# Patient Record
Sex: Female | Born: 1975 | Race: Black or African American | Hispanic: No | Marital: Married | Smoking: Never smoker
Health system: Southern US, Community
[De-identification: ages and names within clinical notes are randomized; demographics above are authoritative.]

## PROBLEM LIST (undated history)

## (undated) DIAGNOSIS — N200 Calculus of kidney: Secondary | ICD-10-CM

## (undated) DIAGNOSIS — I10 Essential (primary) hypertension: Secondary | ICD-10-CM

## (undated) HISTORY — DX: Calculus of kidney: N20.0

## (undated) HISTORY — PX: TUBAL LIGATION: SHX77

---

## 2015-02-27 ENCOUNTER — Encounter: Payer: Self-pay | Admitting: Emergency Medicine

## 2015-02-27 ENCOUNTER — Ambulatory Visit
Admission: EM | Admit: 2015-02-27 | Discharge: 2015-02-27 | Disposition: A | Discharge status: Home / Self Care | Payer: BC Managed Care – PPO | Attending: Family Medicine | Admitting: Family Medicine

## 2015-02-27 DIAGNOSIS — H6120 Impacted cerumen, unspecified ear: Secondary | ICD-10-CM

## 2015-02-27 DIAGNOSIS — J029 Acute pharyngitis, unspecified: Secondary | ICD-10-CM

## 2015-02-27 DIAGNOSIS — H6123 Impacted cerumen, bilateral: Secondary | ICD-10-CM | POA: Diagnosis not present

## 2015-02-27 HISTORY — DX: Essential (primary) hypertension: I10

## 2015-02-27 LAB — RAPID STREP SCREEN (MED CTR MEBANE ONLY): Streptococcus, Group A Screen (Direct): NEGATIVE

## 2015-02-27 NOTE — ED Provider Notes (Addendum)
CSN: [id number]     Arrival date & time 02/27/15  1420 History   First MD Initiated Contact with Patient 02/27/15 1529     Chief Complaint  Patient presents with  . Sore Throat   (Consider location/radiation/quality/duration/timing/severity/associated sxs/prior Treatment) HPI  This a 39 year old female who presents with a sore throat with white patches and  ear pain lerft is greater than right  For 2-3 days. She has no fever or chills but has had some night sweats. She's had no discharge from her ears. Denies any coughing.        Past Medical History  Diagnosis Date  . Hypertension    Past Surgical History  Procedure Laterality Date  . Tubal ligation     History reviewed. No pertinent family history. Social History  Substance Use Topics  . Smoking status: Never Smoker   . Smokeless tobacco: None  . Alcohol Use: No   OB History    No data available     Review of Systems  Constitutional: Negative for fever and chills.  HENT: Positive for ear pain and mouth sores.   All other systems reviewed and are negative.   Allergies  Review of patient's allergies indicates no known allergies.  Home Medications   Prior to Admission medications   Medication Sig Start Date End Date Taking? Authorizing Provider  lisinopril (PRINIVIL,ZESTRIL) 10 MG tablet Take 10 mg by mouth daily.   Yes Historical Provider, MD  tolterodine (DETROL) 2 MG tablet Take 2 mg by mouth 2 (two) times daily.   Yes Historical Provider, MD   Meds Ordered and Administered this Visit  Medications - No data to display  BP 128/95 mmHg  Pulse 93  Temp(Src) 98.7 F (37.1 C) (Tympanic)  Resp 16  Ht 5\' 6"  (1.676 m)  Wt 190 lb (86.183 kg)  BMI 30.68 kg/m2  SpO2 100%  LMP 02/21/2015 (Exact Date) No data found.   Physical Exam  Constitutional: She is oriented to person, place, and time. She appears well-developed and well-nourished.  HENT:  Head: Normocephalic and atraumatic.  Examination of the right  ear shows cerumen in the fair portion of the canal but the drum is visualized. It is dull but shows no retraction or swelling. There is no erythema. The left TM is not able to be visualized as cerumen is impacted the canal. She has no mastoid tenderness present. The oropharynx shows no swelling of the tonsils but does have some small white exudate present bilaterally. There is no erythema of the pharynx. She has no rhinorrhea. There is no cervical adenopathy present.  Eyes: EOM are normal. Pupils are equal, round, and reactive to light.  Neck: Normal range of motion. Neck supple. No thyromegaly present.  Pulmonary/Chest: Effort normal and breath sounds normal. No respiratory distress. She has no wheezes. She has no rales.  Musculoskeletal: Normal range of motion.  Lymphadenopathy:    She has no cervical adenopathy.  Neurological: She is alert and oriented to person, place, and time.  Skin: Skin is warm and dry.  Psychiatric: She has a normal mood and affect. Her behavior is normal. Judgment and thought content normal.    ED Course  Procedures (including critical care time)  Labs Review Labs Reviewed  RAPID STREP SCREEN (NOT AT [facility])  CULTURE, GROUP A STREP ([place])    Imaging Review No results found.   Visual Acuity Review  Right Eye Distance:   Left Eye Distance:   Bilateral Distance:    Right  Eye Near:   Left Eye Near:    Bilateral Near:     Medications - No data to display  15:54:33 Orders Placed WR  Ear wax removal       MDM   1. Acute pharyngitis, unspecified pharyngitis type   2. Cerumen impaction, unspecified laterality    New Prescriptions   No medications on file  Plan: 1. Diagnosis reviewed with patient 2. rx as per orders; risks, benefits, potential side effects reviewed with patient 3. Recommend supportive treatment with gargles,sucrets. Call 48 hours for culture results.  4. F/u prn if symptoms worsen or don't improve.     Lorin Picket,  PA-C 02/27/15 1621  Lorin Picket, PA-C 03/04/15 2023

## 2015-02-27 NOTE — Discharge Instructions (Signed)

## 2015-02-27 NOTE — ED Notes (Signed)
Patient c/o sore throat for couple of days and also bilateral ear pain.  Patient denies fevers.

## 2015-03-02 LAB — CULTURE, GROUP A STREP (THRC)

## 2015-04-13 DIAGNOSIS — N3946 Mixed incontinence: Secondary | ICD-10-CM | POA: Insufficient documentation

## 2015-04-13 DIAGNOSIS — I10 Essential (primary) hypertension: Secondary | ICD-10-CM | POA: Insufficient documentation

## 2017-03-17 DIAGNOSIS — N816 Rectocele: Secondary | ICD-10-CM | POA: Insufficient documentation

## 2017-07-21 ENCOUNTER — Ambulatory Visit: Payer: Self-pay | Admitting: Obstetrics and Gynecology

## 2017-07-31 ENCOUNTER — Encounter: Payer: Self-pay | Admitting: Advanced Practice Midwife

## 2017-07-31 ENCOUNTER — Ambulatory Visit (INDEPENDENT_AMBULATORY_CARE_PROVIDER_SITE_OTHER): Payer: BC Managed Care – PPO | Admitting: Advanced Practice Midwife

## 2017-07-31 VITALS — BP 124/86 | HR 96 | Ht 66.0 in | Wt 210.0 lb

## 2017-07-31 DIAGNOSIS — B9689 Other specified bacterial agents as the cause of diseases classified elsewhere: Secondary | ICD-10-CM | POA: Diagnosis not present

## 2017-07-31 DIAGNOSIS — N760 Acute vaginitis: Secondary | ICD-10-CM | POA: Diagnosis not present

## 2017-07-31 DIAGNOSIS — B379 Candidiasis, unspecified: Secondary | ICD-10-CM

## 2017-07-31 MED ORDER — METRONIDAZOLE 500 MG PO TABS: 500.0000 mg | ORAL_TABLET | Freq: Two times a day (BID) | ORAL | 0 refills | Status: AC

## 2017-07-31 MED ORDER — FLUCONAZOLE 150 MG PO TABS: 150.0000 mg | ORAL_TABLET | Freq: Once | ORAL | 1 refills | Status: AC

## 2017-07-31 NOTE — Progress Notes (Signed)
S: The patient is here today with complaint of primarily external vaginal itching and irritation. About 2 weeks ago she began having symptoms of discharge and pain with some itching. She took a 3 day OTC Clotrimazole. She started her period on the last day of that treatment. Since then she has mostly noticed the external itching and irritation. The only thing that she has done differently is a bubble bath she took about a week before the onset of symptoms. She has not been sexually active for 7 years. She uses a feminine wash. She does not douche.   O: Vital Signs: BP 124/86 (BP Location: Left Arm, Patient Position: Sitting, Cuff Size: Large)   Pulse 96   Ht 5\' 6"  (1.676 m)   Wt 210 lb (95.3 kg)   LMP 07/20/2017   BMI 33.89 kg/m  Constitutional: Well nourished, well developed female in no acute distress.  HEENT: normal Skin: Warm and dry.  Cardiovascular: Regular rate and rhythm.   Respiratory: Clear to auscultation bilateral. Normal respiratory effort Psych: Alert and Oriented x3. No memory deficits. Normal mood and affect.  MS: normal gait, normal bilateral lower extremity ROM/strength/stability.  Pelvic exam:  is not limited by body habitus EGBUS: within normal limits Vagina: within normal limits and with normal mucosa, small amount thin white discharge Cervix: normal appearance  Wet Prep: negative for yeast, positive for a few clue cells, negative for whiff  A: 42 yo female with mild bacterial vaginosis  P: Rx for metronidazole Rx for diflucan if develops yeast infection Sea salt soak in tub OTC boric acid treatment for vaginal pH Return to clinic PRN  Rod Can, CNM

## 2017-07-31 NOTE — Addendum Note (Signed)
Addended by: Rod Can on: 07/31/2017 04:22 PM   Modules accepted: Orders

## 2017-08-02 LAB — NUSWAB VAGINITIS (VG)
CANDIDA GLABRATA, NAA: NEGATIVE
Candida albicans, NAA: NEGATIVE
Trich vag by NAA: NEGATIVE

## 2018-03-17 ENCOUNTER — Encounter: Payer: Self-pay | Admitting: Emergency Medicine

## 2018-03-17 ENCOUNTER — Emergency Department: Payer: BC Managed Care – PPO

## 2018-03-17 ENCOUNTER — Emergency Department
Admission: EM | Admit: 2018-03-17 | Discharge: 2018-03-17 | Disposition: A | Discharge status: Home / Self Care | Payer: BC Managed Care – PPO | Attending: Emergency Medicine | Admitting: Emergency Medicine

## 2018-03-17 ENCOUNTER — Other Ambulatory Visit: Payer: Self-pay

## 2018-03-17 DIAGNOSIS — K7689 Other specified diseases of liver: Secondary | ICD-10-CM | POA: Insufficient documentation

## 2018-03-17 DIAGNOSIS — Z79899 Other long term (current) drug therapy: Secondary | ICD-10-CM | POA: Insufficient documentation

## 2018-03-17 DIAGNOSIS — K769 Liver disease, unspecified: Secondary | ICD-10-CM

## 2018-03-17 DIAGNOSIS — N200 Calculus of kidney: Secondary | ICD-10-CM | POA: Diagnosis not present

## 2018-03-17 DIAGNOSIS — R1031 Right lower quadrant pain: Secondary | ICD-10-CM | POA: Diagnosis present

## 2018-03-17 DIAGNOSIS — N83201 Unspecified ovarian cyst, right side: Secondary | ICD-10-CM | POA: Insufficient documentation

## 2018-03-17 DIAGNOSIS — I10 Essential (primary) hypertension: Secondary | ICD-10-CM | POA: Insufficient documentation

## 2018-03-17 LAB — URINALYSIS, COMPLETE (UACMP) WITH MICROSCOPIC
BACTERIA UA: NONE SEEN
BILIRUBIN URINE: NEGATIVE
GLUCOSE, UA: NEGATIVE mg/dL
KETONES UR: NEGATIVE mg/dL
Leukocytes, UA: NEGATIVE
Nitrite: NEGATIVE
PROTEIN: 30 mg/dL — AB
Specific Gravity, Urine: 1.017 (ref 1.005–1.030)
pH: 5 (ref 5.0–8.0)

## 2018-03-17 LAB — CBC
HEMATOCRIT: 37.5 % (ref 35.0–47.0)
Hemoglobin: 11.7 g/dL — ABNORMAL LOW (ref 12.0–16.0)
MCH: 26.7 pg (ref 26.0–34.0)
MCHC: 31.3 g/dL — AB (ref 32.0–36.0)
MCV: 85.3 fL (ref 80.0–100.0)
Platelets: 430 10*3/uL (ref 150–440)
RBC: 4.39 MIL/uL (ref 3.80–5.20)
RDW: 14.5 % (ref 11.5–14.5)
WBC: 7.7 10*3/uL (ref 3.6–11.0)

## 2018-03-17 LAB — HCG, QUANTITATIVE, PREGNANCY: hCG, Beta Chain, Quant, S: <1 m[IU]/mL (ref <5)

## 2018-03-17 LAB — COMPREHENSIVE METABOLIC PANEL
ALBUMIN: 4 g/dL (ref 3.5–5.0)
ALK PHOS: 60 U/L (ref 38–126)
ALT: 10 U/L (ref 0–44)
ANION GAP: 10 (ref 5–15)
AST: 17 U/L (ref 15–41)
BUN: 12 mg/dL (ref 6–20)
CHLORIDE: 106 mmol/L (ref 98–111)
CO2: 25 mmol/L (ref 22–32)
Calcium: 9 mg/dL (ref 8.9–10.3)
Creatinine, Ser: 0.61 mg/dL (ref 0.44–1.00)
GFR calc Af Amer: >60 mL/min (ref >60)
GFR calc non Af Amer: >60 mL/min (ref >60)
GLUCOSE: 112 mg/dL — AB (ref 70–99)
POTASSIUM: 3.9 mmol/L (ref 3.5–5.1)
SODIUM: 141 mmol/L (ref 135–145)
Total Bilirubin: 0.5 mg/dL (ref 0.3–1.2)
Total Protein: 8.1 g/dL (ref 6.5–8.1)

## 2018-03-17 LAB — LIPASE, BLOOD: LIPASE: 24 U/L (ref 11–51)

## 2018-03-17 LAB — POCT PREGNANCY, URINE: Preg Test, Ur: NEGATIVE

## 2018-03-17 MED ORDER — ONDANSETRON HCL 4 MG/2ML IJ SOLN
4.0000 mg | Freq: Once | INTRAMUSCULAR | Status: AC
  Administered 2018-03-17: 4 mg via INTRAVENOUS

## 2018-03-17 MED ORDER — ONDANSETRON HCL 4 MG/2ML IJ SOLN
INTRAMUSCULAR | Status: AC
  Administered 2018-03-17: 4 mg via INTRAVENOUS
  Filled 2018-03-17: qty 2

## 2018-03-17 MED ORDER — IOPAMIDOL (ISOVUE-300) INJECTION 61%
100.0000 mL | Freq: Once | INTRAVENOUS | Status: AC | PRN
  Administered 2018-03-17: 100 mL via INTRAVENOUS

## 2018-03-17 MED ORDER — MORPHINE SULFATE (PF) 4 MG/ML IV SOLN
INTRAVENOUS | Status: AC
  Administered 2018-03-17: 4 mg via INTRAVENOUS
  Filled 2018-03-17: qty 1

## 2018-03-17 MED ORDER — MORPHINE SULFATE (PF) 4 MG/ML IV SOLN
4.0000 mg | Freq: Once | INTRAVENOUS | Status: AC
  Administered 2018-03-17: 4 mg via INTRAVENOUS

## 2018-03-17 NOTE — ED Notes (Signed)
Patient transported to CT at this time.  Will continue to monitor. 

## 2018-03-17 NOTE — ED Notes (Signed)
Pt reports having RLQ abdominal pain that started this morning. Pt states that she has also had Nausea and diarrhea. Pt denies fever, chills, vomiting, dysuria, or polyuria. Pt appears uncomfortable at this time, rocking in bed.

## 2018-03-17 NOTE — ED Triage Notes (Signed)
R lower abdominal pain began this am.

## 2018-03-17 NOTE — ED Provider Notes (Signed)
[facility] Emergency Department Provider Note  ____________________________________________  Time seen: Approximately 11:32 AM  I have reviewed the triage vital signs and the nursing notes.   HISTORY  Chief Complaint Abdominal Pain   HPI Lauren Maddox is a 42 y.o. female with a history of hypertension who presents for evaluation of abdominal pain.  Patient reports that she woke up in her usual state of health.  She was sitting on a desk work and this morning when she started having pain in the right lower quadrant.  She reports that the pain was sudden onset, located in the right lower quadrant, constant and nonradiating, the pain is sharp.  She denies any prior history of kidney stones.  She was told once that she had a small ovarian cyst.  Last menstrual period was 3 weeks ago.  Patient has had tubal ligation but no other abdominal surgeries.  She has had mild nausea but no vomiting, no diarrhea or constipation, no dysuria, no vaginal bleeding, no fever or chills.  Past Medical History:  Diagnosis Date  . Hypertension     Patient Active Problem List   Diagnosis Date Noted  . Rectocele 03/17/2017  . Urinary incontinence, mixed 04/13/2015  . Benign essential HTN 04/13/2015    Past Surgical History:  Procedure Laterality Date  . TUBAL LIGATION      Prior to Admission medications   Medication Sig Start Date End Date Taking? Authorizing Provider  aluminum chloride (DRYSOL) 20 % external solution Apply topically. 05/26/17 05/26/18  [provider]  clobetasol (TEMOVATE) 0.05 % external solution Apply topically. 05/26/17 05/26/18  [provider]  econazole nitrate 1 % cream Apply topically. 04/05/17 04/05/18  [provider]  ibuprofen (ADVIL,MOTRIN) 800 MG tablet Take 800 mg by mouth. 02/07/17   [provider]  losartan (COZAAR) 50 MG tablet  07/13/17   [provider]  norethindrone  (MICRONOR,CAMILA,ERRIN) 0.35 MG tablet Take by mouth. 02/03/17 02/03/18  [provider]  oxybutynin (DITROPAN-XL) 5 MG 24 hr tablet  06/20/17   [provider]  tolterodine (DETROL) 2 MG tablet Take 2 mg by mouth 2 (two) times daily.    [provider]  triamcinolone cream (KENALOG) 0.1 % Apply topically. 05/26/17 05/26/18  [provider]    Allergies Patient has no known allergies.  No family history on file.  Social History Social History   Tobacco Use  . Smoking status: Never Smoker  . Smokeless tobacco: Never Used  Substance Use Topics  . Alcohol use: No  . Drug use: No    Review of Systems  Constitutional: Negative for fever. Eyes: Negative for visual changes. ENT: Negative for sore throat. Neck: No neck pain  Cardiovascular: Negative for chest pain. Respiratory: Negative for shortness of breath. Gastrointestinal: + RLQ abdominal pain and nausea. No vomiting or diarrhea. Genitourinary: Negative for dysuria. Musculoskeletal: Negative for back pain. Skin: Negative for rash. Neurological: Negative for headaches, weakness or numbness. Psych: No SI or HI  ____________________________________________   PHYSICAL EXAM:  VITAL SIGNS: ED Triage Vitals  Enc Vitals Group     BP 03/17/18 1022 (!) 149/94     Pulse Rate 03/17/18 1022 100     Resp 03/17/18 1022 18     Temp 03/17/18 1022 98.1 F (36.7 C)     Temp Source 03/17/18 1022 Oral     SpO2 03/17/18 1022 98 %     Weight 03/17/18 1025 200 lb (90.7 kg)  Height 03/17/18 1025 5\' 6"  (1.676 m)     Head Circumference --      Peak Flow --      Pain Score 03/17/18 1024 8     Pain Loc --      Pain Edu? --      Excl. in [place]? --     Constitutional: Alert and oriented. Well appearing and in no apparent distress. HEENT:      Head: Normocephalic and atraumatic.         Eyes: Conjunctivae are normal. Sclera is non-icteric.       Mouth/Throat: Mucous membranes are moist.       Neck:  Supple with no signs of meningismus. Cardiovascular: Regular rate and rhythm. No murmurs, gallops, or rubs. 2+ symmetrical distal pulses are present in all extremities. No JVD. Respiratory: Normal respiratory effort. Lungs are clear to auscultation bilaterally. No wheezes, crackles, or rhonchi.  Gastrointestinal: Soft, mild tenderness to palpation over the RLQ, and non distended with positive bowel sounds. No rebound or guarding. Genitourinary: No CVA tenderness. Musculoskeletal: Nontender with normal range of motion in all extremities. No edema, cyanosis, or erythema of extremities. Neurologic: Normal speech and language. Face is symmetric. Moving all extremities. No gross focal neurologic deficits are appreciated. Skin: Skin is warm, dry and intact. No rash noted. Psychiatric: Mood and affect are normal. Speech and behavior are normal.  ____________________________________________   LABS (all labs ordered are listed, but only abnormal results are displayed)  Labs Reviewed  COMPREHENSIVE METABOLIC PANEL - Abnormal; Notable for the following components:      Result Value   Glucose, Bld 112 (*)    All other components within normal limits  CBC - Abnormal; Notable for the following components:   Hemoglobin 11.7 (*)    MCHC 31.3 (*)    All other components within normal limits  URINALYSIS, COMPLETE (UACMP) WITH MICROSCOPIC - Abnormal; Notable for the following components:   Color, Urine YELLOW (*)    APPearance HAZY (*)    Hgb urine dipstick LARGE (*)    Protein, ur 30 (*)    RBC / HPF >50 (*)    All other components within normal limits  LIPASE, BLOOD  HCG, QUANTITATIVE, PREGNANCY  POC URINE PREG, ED  POCT PREGNANCY, URINE   ____________________________________________  EKG  none  ____________________________________________  RADIOLOGY  I have personally reviewed the images performed during this visit and I agree with the Radiologist's read.   Interpretation by  Radiologist:  US Pelvis Transvanginal Non-ob (tv Only)  Result Date: 03/17/2018 CLINICAL DATA:  RIGHT lower quadrant pain for 1 day question ovarian torsion EXAM: TRANSABDOMINAL AND TRANSVAGINAL ULTRASOUND OF PELVIS DOPPLER ULTRASOUND OF OVARIES TECHNIQUE: Both transabdominal and transvaginal ultrasound examinations of the pelvis were performed. Transabdominal technique was performed for global imaging of the pelvis including uterus, ovaries, adnexal regions, and pelvic cul-de-sac. It was necessary to proceed with endovaginal exam following the transabdominal exam to visualize the endometrium and to characterize a RIGHT ovarian lesion. Color and duplex Doppler ultrasound was utilized to evaluate blood flow to the ovaries. COMPARISON:  None FINDINGS: Uterus Measurements: 12.3 x 7.0 x 8.7 cm. Intramural leiomyoma at posterior upper uterus 2.1 x 2.4 x 2.0 cm. Additional small LEFT lateral leiomyoma, intramural, 1.5 x 1.5 x 1.4 cm. Endometrium Thickness: 13 mm. Small amount of endometrial fluid, nonspecific. Mildly heterogeneous appearing endometrial complex without focal mass. Right ovary Measurements: 3.8 x 3.1 x 3.3 cm. Inadequately visualized by transvaginal imaging, obscured by bowel.  Cyst within RIGHT ovary, 2.4 x 2.6 x 2.9 cm, containing scattered low level internal echoes and questionable partial septation versus artifact. Left ovary Measurements: 2.2 x 2.6 x 2.5 cm.  Normal morphology without mass Pulsed Doppler evaluation of both ovaries demonstrates normal low-resistance arterial and venous waveforms. Other findings No free pelvic fluid.  No additional adnexal masses. IMPRESSION: 2 small uterine leiomyomata. Small amount of endometrial fluid and mild heterogeneity of the endometrial complex, nonspecific. Small minimally complicated RIGHT ovarian cyst 2.9 cm greatest size. No evidence of ovarian torsion. Electronically Signed   By: Lavonia Dana M.D.   On: 03/17/2018 12:01   US Pelvis (transabdominal  Only)  Result Date: 03/17/2018 CLINICAL DATA:  RIGHT lower quadrant pain for 1 day question ovarian torsion EXAM: TRANSABDOMINAL AND TRANSVAGINAL ULTRASOUND OF PELVIS DOPPLER ULTRASOUND OF OVARIES TECHNIQUE: Both transabdominal and transvaginal ultrasound examinations of the pelvis were performed. Transabdominal technique was performed for global imaging of the pelvis including uterus, ovaries, adnexal regions, and pelvic cul-de-sac. It was necessary to proceed with endovaginal exam following the transabdominal exam to visualize the endometrium and to characterize a RIGHT ovarian lesion. Color and duplex Doppler ultrasound was utilized to evaluate blood flow to the ovaries. COMPARISON:  None FINDINGS: Uterus Measurements: 12.3 x 7.0 x 8.7 cm. Intramural leiomyoma at posterior upper uterus 2.1 x 2.4 x 2.0 cm. Additional small LEFT lateral leiomyoma, intramural, 1.5 x 1.5 x 1.4 cm. Endometrium Thickness: 13 mm. Small amount of endometrial fluid, nonspecific. Mildly heterogeneous appearing endometrial complex without focal mass. Right ovary Measurements: 3.8 x 3.1 x 3.3 cm. Inadequately visualized by transvaginal imaging, obscured by bowel. Cyst within RIGHT ovary, 2.4 x 2.6 x 2.9 cm, containing scattered low level internal echoes and questionable partial septation versus artifact. Left ovary Measurements: 2.2 x 2.6 x 2.5 cm.  Normal morphology without mass Pulsed Doppler evaluation of both ovaries demonstrates normal low-resistance arterial and venous waveforms. Other findings No free pelvic fluid.  No additional adnexal masses. IMPRESSION: 2 small uterine leiomyomata. Small amount of endometrial fluid and mild heterogeneity of the endometrial complex, nonspecific. Small minimally complicated RIGHT ovarian cyst 2.9 cm greatest size. No evidence of ovarian torsion. Electronically Signed   By: Lavonia Dana M.D.   On: 03/17/2018 12:01   Ct Abdomen Pelvis W Contrast  Result Date: 03/17/2018 CLINICAL DATA:  RIGHT  lower quadrant pain since this morning, normal white blood cell count, negative pregnancy test, history hypertension, tubal ligation EXAM: CT ABDOMEN AND PELVIS WITH CONTRAST TECHNIQUE: Multidetector CT imaging of the abdomen and pelvis was performed using the standard protocol following bolus administration of intravenous contrast. Sagittal and coronal MPR images reconstructed from axial data set. CONTRAST:  156mL ISOVUE-300 IOPAMIDOL (ISOVUE-300) INJECTION 61% IV. No oral contrast. COMPARISON:  None FINDINGS: Lower chest: Lung bases clear Hepatobiliary: Non-specific 2.4 x 1.9 x 1.5 cm diameter RIGHT lobe liver mass superiorly, indeterminate. Additional tiny probable cyst superiorly LEFT lobe liver 6 mm diameter. Gallbladder and remainder of liver unremarkable. Pancreas: Normal appearance Spleen: Normal appearance Adrenals/Urinary Tract: Adrenal glands and LEFT kidney normal appearance. Mild RIGHT hydronephrosis and hydroureter secondary to a 5 mm RIGHT ureterovesical junction calculus. Mild RIGHT perinephric and peri ureteral edema. LEFT ureter and bladder otherwise unremarkable. Stomach/Bowel: Normal appendix. Stomach and bowel loops grossly normal appearance for technique. Vascular/Lymphatic: Aorta normal caliber. No adenopathy. Few pelvic phleboliths. Reproductive: Uterus mildly prominent in size with multiple nabothian cysts at cervix. RIGHT ovarian cyst 3.0 x 2.3 cm. Follicles in both ovaries. Other: Small  amount of nonspecific cul-de-sac free fluid. No free air. Tiny umbilical hernia containing fat. Musculoskeletal: No acute osseous findings. IMPRESSION: RIGHT hydronephrosis and hydroureter secondary to a 5 mm RIGHT UVJ calculus. Indeterminate 2.4 x 1.9 x 1.5 cm diameter low-attenuation liver lesion; follow-up non emergent MR imaging with and without contrast recommended to characterize. Electronically Signed   By: Lavonia Dana M.D.   On: 03/17/2018 13:30   US Pelvic Doppler (torsion R/o Or Mass Arterial  Flow)  Result Date: 03/17/2018 CLINICAL DATA:  RIGHT lower quadrant pain for 1 day question ovarian torsion EXAM: TRANSABDOMINAL AND TRANSVAGINAL ULTRASOUND OF PELVIS DOPPLER ULTRASOUND OF OVARIES TECHNIQUE: Both transabdominal and transvaginal ultrasound examinations of the pelvis were performed. Transabdominal technique was performed for global imaging of the pelvis including uterus, ovaries, adnexal regions, and pelvic cul-de-sac. It was necessary to proceed with endovaginal exam following the transabdominal exam to visualize the endometrium and to characterize a RIGHT ovarian lesion. Color and duplex Doppler ultrasound was utilized to evaluate blood flow to the ovaries. COMPARISON:  None FINDINGS: Uterus Measurements: 12.3 x 7.0 x 8.7 cm. Intramural leiomyoma at posterior upper uterus 2.1 x 2.4 x 2.0 cm. Additional small LEFT lateral leiomyoma, intramural, 1.5 x 1.5 x 1.4 cm. Endometrium Thickness: 13 mm. Small amount of endometrial fluid, nonspecific. Mildly heterogeneous appearing endometrial complex without focal mass. Right ovary Measurements: 3.8 x 3.1 x 3.3 cm. Inadequately visualized by transvaginal imaging, obscured by bowel. Cyst within RIGHT ovary, 2.4 x 2.6 x 2.9 cm, containing scattered low level internal echoes and questionable partial septation versus artifact. Left ovary Measurements: 2.2 x 2.6 x 2.5 cm.  Normal morphology without mass Pulsed Doppler evaluation of both ovaries demonstrates normal low-resistance arterial and venous waveforms. Other findings No free pelvic fluid.  No additional adnexal masses. IMPRESSION: 2 small uterine leiomyomata. Small amount of endometrial fluid and mild heterogeneity of the endometrial complex, nonspecific. Small minimally complicated RIGHT ovarian cyst 2.9 cm greatest size. No evidence of ovarian torsion. Electronically Signed   By: Lavonia Dana M.D.   On: 03/17/2018 12:01      ____________________________________________   PROCEDURES  Procedure(s)  performed: None Procedures Critical Care performed:  None ____________________________________________   INITIAL IMPRESSION / ASSESSMENT AND PLAN / ED COURSE   42 y.o. female with a history of hypertension who presents for evaluation of abdominal pain.  Patient complained of sudden onset of sharp right lower quadrant abdominal pain, she is otherwise well-appearing with normal vital signs, abdomen is soft with mild right lower quadrant tenderness.  Differential diagnoses including ovarian cyst versus ovarian torsion versus kidney stone versus TOA versus ectopic versus appendicitis versus terminal ileitis versus cystitis. Labs showing no leukocytosis, normal LFTs and lipase, normal kidney function, negative urine pregnancy test. UA positive for hematuria. Patient given morphine and zofran for symptom relief. Will start with [place] to rule out torsion since it is the most time sensitive diagnosis and if negative will pursue CT a/p.  Clinical Course as of Mar 17 1354  Sat Mar 17, 2018  1352 Ultrasound showing a small right-sided ovarian cyst with questionable septations.  No ovarian ruptures or torsion.  Patient also noted to have hematuria therefore a CT renal was done which confirmed a 5 mm right UVJ stone with mild right-sided hydronephrosis.  Patient's pain is fully resolved at this time and therefore I believe stone has passed into the bladder.  There is no signs of infection or acute kidney injury.  CT also showed incidental finding of a  small liver lesion.  Discussed those findings with the patient and recommended follow-up with her OB/GYN for repeat imaging of the ovarian cyst and primary care doctor for MRI of the liver.  Discussed return precautions.   [CV]    Clinical Course User Index [CV] Alfred Levins [place], MD     As part of my medical decision making, I reviewed the following data within the [facility] notes reviewed and incorporated, Labs reviewed , Old chart  reviewed, Radiograph reviewed , Notes from prior ED visits and [place] Controlled Substance Database    Pertinent labs & imaging results that were available during my care of the patient were reviewed by me and considered in my medical decision making (see chart for details).    ____________________________________________   FINAL CLINICAL IMPRESSION(S) / ED DIAGNOSES  Final diagnoses:  RLQ abdominal pain  Kidney stone  Liver lesion  Right ovarian cyst      NEW MEDICATIONS STARTED DURING THIS VISIT:  ED Discharge Orders    None       Note:  This document was prepared using Dragon voice recognition software and may include unintentional dictation errors.    Rudene Re, MD 03/17/18 [phone]

## 2018-03-17 NOTE — Discharge Instructions (Addendum)
As I explained to you your ultrasound showed a cyst in your right ovary.  It is important that you follow-up with your OB/GYN to have repeat ultrasound done within 1 to 2 weeks as part of the cyst was not visualized on ultrasound.  Most cysts are benign but some can be cancer and therefore it is important that you have follow-up imaging.  Your CT also showed a small lesion in your liver and the radiologist recommended an MRI.  Your doctor can order that for you.  Make sure to discuss that with your doctor this coming week.  Your pain was caused by kidney stone.  Drink lots of water to keep yourself hydrated.  Return to the emergency room if the pain recurs, if you have vomiting, abdominal pain, burning with urination, or fever.

## 2018-05-02 ENCOUNTER — Ambulatory Visit (INDEPENDENT_AMBULATORY_CARE_PROVIDER_SITE_OTHER): Payer: BC Managed Care – PPO | Admitting: Maternal Newborn

## 2018-05-02 ENCOUNTER — Encounter: Payer: Self-pay | Admitting: Maternal Newborn

## 2018-05-02 ENCOUNTER — Other Ambulatory Visit (HOSPITAL_COMMUNITY)
Admission: RE | Admit: 2018-05-02 | Discharge: 2018-05-02 | Disposition: A | Discharge status: Home / Self Care | Payer: BC Managed Care – PPO | Source: Ambulatory Visit | Attending: Maternal Newborn | Admitting: Maternal Newborn

## 2018-05-02 VITALS — BP 110/60 | Ht 66.0 in | Wt 209.0 lb

## 2018-05-02 DIAGNOSIS — N898 Other specified noninflammatory disorders of vagina: Secondary | ICD-10-CM

## 2018-05-02 DIAGNOSIS — Z124 Encounter for screening for malignant neoplasm of cervix: Secondary | ICD-10-CM

## 2018-05-02 DIAGNOSIS — Z01419 Encounter for gynecological examination (general) (routine) without abnormal findings: Secondary | ICD-10-CM

## 2018-05-02 DIAGNOSIS — R300 Dysuria: Secondary | ICD-10-CM

## 2018-05-02 DIAGNOSIS — Z1231 Encounter for screening mammogram for malignant neoplasm of breast: Secondary | ICD-10-CM

## 2018-05-02 DIAGNOSIS — R102 Pelvic and perineal pain: Secondary | ICD-10-CM

## 2018-05-02 LAB — POCT URINALYSIS DIPSTICK
Bilirubin, UA: NEGATIVE
Glucose, UA: NEGATIVE
KETONES UA: NEGATIVE
Leukocytes, UA: NEGATIVE
Nitrite, UA: NEGATIVE
PH UA: 5 (ref 5.0–8.0)
Protein, UA: NEGATIVE
RBC UA: NEGATIVE
Spec Grav, UA: 1.01 (ref 1.010–1.025)
UROBILINOGEN UA: 0.2 U/dL

## 2018-05-02 NOTE — Progress Notes (Signed)
Gynecology Annual Exam  PCP: [facility]  Chief Complaint:  Chief Complaint  Patient presents with  . Gynecologic Exam    ER month ago possible cyst on right ovary. still has left LLQ pain. bleeding during sex.  . Contraception    discuss pill options  . Urinary Tract Infection    History of Present Illness: Patient is a 42 y.o. female presenting today for an annual exam.  LMP: Patient's last menstrual period was 04/18/2018 (exact date). Average Interval: regular, 28 days Duration of flow: several days Heavy Menses: yes Clots: yes Intermenstrual Bleeding: no Postcoital Bleeding: yes Dysmenorrhea: yes, severe with cramping and nausea  The patient is sexually active. She currently uses tubal ligation for contraception. She denies dyspareunia.  The patient occasionally  performs self breast exams.  There is notable family history of breast cancer in her maternal aunt and paternal aunt. No history of ovarian cancer in her family. MyRisk screening discussed and brochure given.  Review of Systems  Constitutional: Negative.   HENT: Negative.   Eyes: Negative.   Respiratory: Negative for cough, shortness of breath and wheezing.   Cardiovascular: Negative for chest pain and palpitations.  Gastrointestinal: Negative.   Genitourinary:       Pelvic pain in LLQ Vaginal bleeding after intercourse Odor  Musculoskeletal: Negative.   Skin: Negative.   Neurological: Negative.   Endo/Heme/Allergies: Negative.   Psychiatric/Behavioral: Negative.   All other systems reviewed and are negative.   Past Medical History:  Past Medical History:  Diagnosis Date  . Hypertension   . Hypertension   . Kidney stone     Past Surgical History:  Past Surgical History:  Procedure Laterality Date  . TUBAL LIGATION      Gynecologic History:  Patient's last menstrual period was 04/18/2018 (exact date). Contraception: tubal ligation Last Pap: Several years ago. Results were: Normal  per patient. Last mammogram: Has had a previous mammogram that showed dense breasts but otherwise normal per patient report.  Obstetric History: No obstetric history on file.  Family History:  Family History  Problem Relation Age of Onset  . Stroke Mother   . Hypertension Mother   . Diabetes Father   . Breast cancer Maternal Aunt   . Breast cancer Paternal Aunt   . Stroke Maternal Grandfather     Social History:  Social History   Socioeconomic History  . Marital status: Married    Spouse name: Not on file  . Number of children: Not on file  . Years of education: Not on file  . Highest education level: Not on file  Occupational History  . Not on file  Social Needs  . Financial resource strain: Not on file  . Food insecurity:    Worry: Not on file    Inability: Not on file  . Transportation needs:    Medical: Not on file    Non-medical: Not on file  Tobacco Use  . Smoking status: Never Smoker  . Smokeless tobacco: Never Used  Substance and Sexual Activity  . Alcohol use: Yes    Comment: occasional  . Drug use: No  . Sexual activity: Yes  Lifestyle  . Physical activity:    Days per week: Not on file    Minutes per session: Not on file  . Stress: Not on file  Relationships  . Social connections:    Talks on phone: Not on file    Gets together: Not on file    Attends religious service:  Not on file    Active member of club or organization: Not on file    Attends meetings of clubs or organizations: Not on file    Relationship status: Not on file  . Intimate partner violence:    Fear of current or ex partner: Not on file    Emotionally abused: Not on file    Physically abused: Not on file    Forced sexual activity: Not on file  Other Topics Concern  . Not on file  Social History Narrative  . Not on file    Allergies:  No Known Allergies  Medications: Prior to Admission medications   Medication Sig Start Date End Date Taking? Authorizing Provider    aluminum chloride (DRYSOL) 20 % external solution Apply topically. 05/26/17 05/26/18 Yes [provider]  clobetasol (TEMOVATE) 0.05 % external solution Apply topically. 05/26/17 05/26/18 Yes [provider]  ibuprofen (ADVIL,MOTRIN) 800 MG tablet Take 800 mg by mouth. 02/07/17  Yes [provider]  losartan (COZAAR) 50 MG tablet  07/13/17  Yes [provider]  triamcinolone cream (KENALOG) 0.1 % Apply topically. 05/26/17 05/26/18 Yes [provider]  norethindrone (MICRONOR,CAMILA,ERRIN) 0.35 MG tablet Take by mouth. 02/03/17 02/03/18  [provider]  oxybutynin (DITROPAN-XL) 5 MG 24 hr tablet  06/20/17   [provider]  tolterodine (DETROL) 2 MG tablet Take 2 mg by mouth 2 (two) times daily.    [provider]    Physical Exam Vitals: Blood pressure 110/60, height 5\' 6"  (1.676 m), weight 209 lb (94.8 kg), last menstrual period 04/18/2018.  General: NAD HEENT: normocephalic, anicteric Thyroid: no enlargement, no palpable nodules Pulmonary: No increased work of breathing, CTAB Cardiovascular: RRR, no murmurs, rubs, or gallops Breast: Breasts symmetrical, no tenderness, no palpable nodules or masses, no skin or nipple retraction present, no nipple discharge.  No axillary or supraclavicular lymphadenopathy. Abdomen: Soft, non-tender, non-distended.  Umbilicus without lesions.  No hepatomegaly, splenomegaly or masses palpable. No evidence of hernia  Genitourinary:  External: Normal external female genitalia.  Normal urethral  meatus, normal Bartholin's and Skene's glands.    Vagina: Normal vaginal mucosa, no evidence of prolapse.    Cervix: Grossly normal in appearance, small amount of  bleeding upon sampling for Pap  Uterus: Non-enlarged, mobile, normal contour.  No CMT  Adnexa: ovaries non-enlarged, no palpable adnexal masses,  tenderness to palpation in LLQ  Rectal: deferred  Lymphatic: no evidence of inguinal  lymphadenopathy Extremities: no edema, erythema, or tenderness Neurologic: Grossly intact Psychiatric: mood appropriate, affect full  Assessment: 42 y.o. for an annual exam with LLQ pain, heavy bleeding with cycles, bleeding after intercourse, vaginal odor  Plan: Problem List Items Addressed This Visit    None    Visit Diagnoses    Dysuria    -  Primary   Relevant Orders   POCT urinalysis dipstick (Completed)   Women's annual routine gynecological examination       Pelvic pain       Relevant Orders   US PELVIS TRANSVANGINAL NON-OB (TV ONLY)   Breast cancer screening by mammogram       Relevant Orders   MM DIGITAL SCREENING BILATERAL   Vaginal odor       Relevant Orders   Cytology - PAP   Pap smear for cervical cancer screening       Relevant Orders   Cytology - PAP     1) Mammogram - recommend yearly screening mammogram.  Mammogram was ordered today.  2)  ASCCP guidelines and rationale discussed.  Patient opts for yearly screening interval.  4) Contraception - She has had a tubal ligation.  5) Colonoscopy -- Screening recommended starting at age 35 for average risk individuals, age 45 for individuals deemed at increased risk (including African Americans) and recommended to continue until age 40.  For patient age 5-85 individualized approach is recommended.  Gold standard screening is via colonoscopy, Cologuard screening is an acceptable alternative for patient unwilling or unable to undergo colonoscopy.  "Colorectal cancer screening for average?risk adults: 2018 guideline update from the American Cancer Society"CA: A Cancer Journal for Clinicians: Nov 09, 2016   6) Routine healthcare maintenance including cholesterol, diabetes screening managed by PCP.  7) Ongoing pelvic pain in left lower quadrant, and was seen in ER for new sharp pain on the right side which has since resolved. Follow up with ultrasound, ordered today.  8) She has heavy menstrual bleeding during first 1-2  days of her cycle with clots. She was previously prescribed OCPs which worked well for cycle control, but was switched to a POP with hypertension diagnosis. These have not worked well to help with the bleeding. Suggested that MD decide on appropriate therapy with her upon viewing results of pelvic ultrasound.  9) She has had some bleeding with intercourse recently and noticed an odor, but no abnormal discharge apart from the bleeding. Normal appearing cervix on speculum exam. Will test for candida and BV with Pap.  Return in about 1 day (around 05/03/2018) for Pelvic pain /ultrasound/ follow up with MD.   Avel Sensor, CNM 05/02/2018  2:28 PM

## 2018-05-04 ENCOUNTER — Telehealth: Payer: Self-pay

## 2018-05-04 NOTE — Telephone Encounter (Signed)
Pt calling requesting results from visit this week.

## 2018-05-07 ENCOUNTER — Other Ambulatory Visit: Payer: Self-pay | Admitting: Maternal Newborn

## 2018-05-07 LAB — CYTOLOGY - PAP
Bacterial vaginitis: NEGATIVE
CANDIDA VAGINITIS: NEGATIVE
Diagnosis: UNDETERMINED — AB
HPV (WINDOPATH): DETECTED — AB
HPV 16/18/45 genotyping: NEGATIVE

## 2018-05-07 NOTE — Progress Notes (Signed)
Patient to schedule colposcopy for ASCUS with positive HPV.

## 2018-05-22 ENCOUNTER — Other Ambulatory Visit: Payer: BC Managed Care – PPO

## 2018-05-22 ENCOUNTER — Ambulatory Visit: Payer: BC Managed Care – PPO | Admitting: Obstetrics and Gynecology

## 2018-05-31 ENCOUNTER — Ambulatory Visit (INDEPENDENT_AMBULATORY_CARE_PROVIDER_SITE_OTHER): Payer: BC Managed Care – PPO | Admitting: Obstetrics and Gynecology

## 2018-05-31 ENCOUNTER — Encounter: Payer: Self-pay | Admitting: Obstetrics and Gynecology

## 2018-05-31 ENCOUNTER — Other Ambulatory Visit (HOSPITAL_COMMUNITY)
Admission: RE | Admit: 2018-05-31 | Discharge: 2018-05-31 | Disposition: A | Discharge status: Home / Self Care | Payer: BC Managed Care – PPO | Source: Ambulatory Visit | Attending: Obstetrics and Gynecology | Admitting: Obstetrics and Gynecology

## 2018-05-31 ENCOUNTER — Ambulatory Visit (INDEPENDENT_AMBULATORY_CARE_PROVIDER_SITE_OTHER): Payer: BC Managed Care – PPO

## 2018-05-31 VITALS — BP 126/84 | Ht 66.0 in | Wt 210.0 lb

## 2018-05-31 DIAGNOSIS — D251 Intramural leiomyoma of uterus: Secondary | ICD-10-CM | POA: Diagnosis not present

## 2018-05-31 DIAGNOSIS — R87610 Atypical squamous cells of undetermined significance on cytologic smear of cervix (ASC-US): Secondary | ICD-10-CM

## 2018-05-31 DIAGNOSIS — R102 Pelvic and perineal pain: Secondary | ICD-10-CM | POA: Diagnosis not present

## 2018-05-31 DIAGNOSIS — N946 Dysmenorrhea, unspecified: Secondary | ICD-10-CM | POA: Diagnosis not present

## 2018-05-31 DIAGNOSIS — R87810 Cervical high risk human papillomavirus (HPV) DNA test positive: Secondary | ICD-10-CM | POA: Diagnosis present

## 2018-05-31 DIAGNOSIS — D250 Submucous leiomyoma of uterus: Secondary | ICD-10-CM | POA: Diagnosis not present

## 2018-05-31 DIAGNOSIS — N920 Excessive and frequent menstruation with regular cycle: Secondary | ICD-10-CM

## 2018-05-31 DIAGNOSIS — D259 Leiomyoma of uterus, unspecified: Secondary | ICD-10-CM | POA: Insufficient documentation

## 2018-05-31 NOTE — Progress Notes (Signed)
HPI:  Lauren Maddox is a 42 y.o.  No obstetric history on file.  who presents today for evaluation and management of abnormal cervical cytology.    Dysplasia History:  ASCUS, HPV  +  OB History  No obstetric history on file.    Past Medical History:  Diagnosis Date  . Hypertension   . Hypertension   . Kidney stone     Past Surgical History:  Procedure Laterality Date  . TUBAL LIGATION      SOCIAL HISTORY: Social History   Substance and Sexual Activity  Alcohol Use Yes   Comment: occasional   Social History   Substance and Sexual Activity  Drug Use No     Family History  Problem Relation Age of Onset  . Stroke Mother   . Hypertension Mother   . Diabetes Father   . Breast cancer Maternal Aunt   . Breast cancer Paternal Aunt   . Stroke Maternal Grandfather     ALLERGIES:  Patient has no known allergies.  Current Outpatient Medications on File Prior to Visit  Medication Sig Dispense Refill  . ibuprofen (ADVIL,MOTRIN) 800 MG tablet Take 800 mg by mouth.    . losartan (COZAAR) 50 MG tablet     . norethindrone (MICRONOR,CAMILA,ERRIN) 0.35 MG tablet Take by mouth.    . oxybutynin (DITROPAN-XL) 5 MG 24 hr tablet     . tolterodine (DETROL) 2 MG tablet Take 2 mg by mouth 2 (two) times daily.     No current facility-administered medications on file prior to visit.     Physical Exam: -Vitals:  BP 126/84   Ht 5\' 6"  (1.676 m)   Wt 210 lb (95.3 kg)   LMP 05/17/2018   BMI 33.89 kg/m  GEN: WD, WN, NAD.  A+ O x 3, good mood and affect. ABD:  NT, ND.  Soft, no masses.  No hernias noted.   Pelvic:   Vulva: Normal appearance.  No lesions.  Vagina: No lesions or abnormalities noted.  Support: Normal pelvic support.  Urethra No masses tenderness or scarring.  Meatus Normal size without lesions or prolapse.  Cervix: See below.  Anus: Normal exam.  No lesions.  Perineum: Normal exam.  No lesions.        Bimanual   Uterus: Normal size.  Non-tender.   Mobile.  AV.  Adnexae: No masses.  Non-tender to palpation.  Cul-de-sac: Negative for abnormality.   PROCEDURE: 1.  Urine Pregnancy Test:  not done 2.  Colposcopy performed with 4% acetic acid after verbal consent obtained                                         -Aceto-white Lesions Location(s): mildly anteriorly and posteriorly.              -Biopsy performed at 5 and 10 o'clock               -ECC indicated and performed: Yes.       -Biopsy sites made hemostatic with pressure, AgNO3, and/or Monsel's solution   -Satisfactory colposcopy: No.    -Evidence of Invasive cervical CA :  NO  ASSESSMENT:  Lauren Maddox is a 42 y.o. No obstetric history on file. here for  1. ASCUS with positive high risk HPV cervical   2. Menorrhagia with regular cycle   3. Intramural leiomyoma of uterus   4.  Dysmenorrhea   .  PLAN: I discussed the grading system of pap smears and HPV high risk viral types.  We will discuss and base management after colpo results return.     Prentice Docker, MD  [facility], [facility] 05/31/2018  11:14 AM

## 2018-05-31 NOTE — Progress Notes (Signed)
Gynecology Ultrasound Follow Up   Chief Complaint  Patient presents with  . Colposcopy  . Follow-up    u/s follow up  Menorrhagia with regular cycles  History of Present Illness: Patient is a 42 y.o. female who presents today for ultrasound evaluation of the above.  Ultrasound demonstrates the following findings Adnexa: no masses seen  Uterus: anteverted with endometrial stripe  16.1 mm Additional: three small fibroids noted Fibroid 1: 3.3 x 3.1 x 2.3 cm (mid/posterior, intramural) Fibroid 2: 2.0 x 2.3 x 1.8 cm (mid/anterior, submucosal) Fibroid 3: 1.1 x 1.2 x 1.1 cm (mid/anterior, intramural)  Past Medical History:  Diagnosis Date  . Hypertension   . Hypertension   . Kidney stone     Past Surgical History:  Procedure Laterality Date  . TUBAL LIGATION      Family History  Problem Relation Age of Onset  . Stroke Mother   . Hypertension Mother   . Diabetes Father   . Breast cancer Maternal Aunt   . Breast cancer Paternal Aunt   . Stroke Maternal Grandfather     Social History   Socioeconomic History  . Marital status: Married    Spouse name: Not on file  . Number of children: Not on file  . Years of education: Not on file  . Highest education level: Not on file  Occupational History  . Not on file  Social Needs  . Financial resource strain: Not on file  . Food insecurity:    Worry: Not on file    Inability: Not on file  . Transportation needs:    Medical: Not on file    Non-medical: Not on file  Tobacco Use  . Smoking status: Never Smoker  . Smokeless tobacco: Never Used  Substance and Sexual Activity  . Alcohol use: Yes    Comment: occasional  . Drug use: No  . Sexual activity: Yes  Lifestyle  . Physical activity:    Days per week: Not on file    Minutes per session: Not on file  . Stress: Not on file  Relationships  . Social connections:    Talks on phone: Not on file    Gets together: Not on file    Attends religious service: Not on  file    Active member of club or organization: Not on file    Attends meetings of clubs or organizations: Not on file    Relationship status: Not on file  . Intimate partner violence:    Fear of current or ex partner: Not on file    Emotionally abused: Not on file    Physically abused: Not on file    Forced sexual activity: Not on file  Other Topics Concern  . Not on file  Social History Narrative  . Not on file    No Known Allergies  Prior to Admission medications   Medication Sig Start Date End Date Taking? Authorizing Provider  ibuprofen (ADVIL,MOTRIN) 800 MG tablet Take 800 mg by mouth. 02/07/17   [provider]  losartan (COZAAR) 50 MG tablet  07/13/17   [provider]  norethindrone (MICRONOR,CAMILA,ERRIN) 0.35 MG tablet Take by mouth. 02/03/17 02/03/18  [provider]  oxybutynin (DITROPAN-XL) 5 MG 24 hr tablet  06/20/17   [provider]  tolterodine (DETROL) 2 MG tablet Take 2 mg by mouth 2 (two) times daily.    [provider]    Physical Exam BP 126/84   Ht 5\' 6"  (1.676 m)  Wt 210 lb (95.3 kg)   LMP 05/17/2018   BMI 33.89 kg/m    General: NAD HEENT: normocephalic, anicteric Pulmonary: No increased work of breathing Extremities: no edema, erythema, or tenderness Neurologic: Grossly intact, normal gait Psychiatric: mood appropriate, affect full  Imaging Results: US Pelvis Transvanginal Non-ob (tv Only)  Result Date: 05/31/2018 Patient Name: Lauren Maddox DOB: 1975/09/02 MRN: [id number] ULTRASOUND REPORT Location: [facility] Date of Service: 05/31/2018 Indications:Pelvic Pain Findings: The uterus is enlarged and anteverted and measures 13.7 x 8.6 x 6.4cm. Echo texture is heterogenous with evidence of focal masses. Within the uterus are multiple suspected fibroids measuring: Fibroid 1:  3.3 x 3.1 x 2.3cm (mid/posterior, Intramural) Fibroid 2:  2.0 x 2.3 x 1.8cm (mid/anterior, Submucosal) Fibroid 3:  1.1 x 1.2 x  1.1cm (mid/anterior, Intramural) The Endometrium measures 16.1 mm. No endometrial masses/fluid seen. Right Ovary measures 3.5 x 2.9 x 2.7cm with dominant follicle seen. Left Ovary measures 3.3 x 2.3 x 2.2 cm. It is normal in appearance. Survey of the adnexa demonstrates no adnexal masses. There is no free fluid in the cul de sac. Impression: 1. Enlarged, heterogeneous uterus with multiple fibroids. Vita Barley, RDMS RVT The ultrasound images and findings were reviewed by me and I agree with the above report. Prentice Docker, MD, [facility], [facility] 05/31/2018 10:43 AM     Assessment: 42 y.o. No obstetric history on file.  1. ASCUS with positive high risk HPV cervical   2. Menorrhagia with regular cycle   3. Intramural leiomyoma of uterus   4. Dysmenorrhea      Plan: Problem List Items Addressed This Visit      Genitourinary   Fibroid uterus   Dysmenorrhea   ASCUS with positive high risk HPV cervical - Primary   Relevant Orders   Surgical pathology     Other   Menorrhagia with regular cycle     Discussed ultrasound findings with patient. Management options include, do nothing at this time, medication therapy with IUD or other hormonal (non-estrogen medication), or surgery with ablation, uterine artery embolization, or hysterectomy. Patient would like to consider and let me know.    15 minutes spent in face to face discussion with > 50% spent in counseling,management, and coordination of care of her menorrhagia with regular cycle, fibroid uterus, dysmenorrhea.   Prentice Docker, MD, [facility], [facility] 06/01/2018 5:13 PM

## 2018-06-01 ENCOUNTER — Encounter: Payer: Self-pay | Admitting: Obstetrics and Gynecology

## 2018-06-04 ENCOUNTER — Telehealth: Payer: Self-pay

## 2018-06-04 NOTE — Telephone Encounter (Signed)
Is probably a normal discharge following colposcopy.  If no other symptoms, I would give it a couple extra days. If still bothersome one week out from procedure, she might need something.

## 2018-06-04 NOTE — Telephone Encounter (Signed)
Spoke w/pt. She had the initial coffee ground d/c then began to have a yellow d/c w/odor. No itching. If rx is sent, pt requests it to be sent to her local [facility] she will get rx transferred & filled where she is.

## 2018-06-04 NOTE — Telephone Encounter (Signed)
Pt is s/p colpo on 12/19; she now has a d/c c odor.  She is out of town and unable to come in.  Can something be rx'd?  [id number]

## 2018-06-04 NOTE — Telephone Encounter (Signed)
Pt aware.

## 2019-04-03 ENCOUNTER — Other Ambulatory Visit: Payer: Self-pay

## 2019-04-03 ENCOUNTER — Emergency Department
Admission: EM | Admit: 2019-04-03 | Discharge: 2019-04-03 | Disposition: A | Discharge status: Home / Self Care | Payer: BC Managed Care – PPO | Attending: Emergency Medicine | Admitting: Emergency Medicine

## 2019-04-03 ENCOUNTER — Emergency Department: Payer: BC Managed Care – PPO

## 2019-04-03 DIAGNOSIS — R918 Other nonspecific abnormal finding of lung field: Secondary | ICD-10-CM | POA: Insufficient documentation

## 2019-04-03 DIAGNOSIS — Z79899 Other long term (current) drug therapy: Secondary | ICD-10-CM | POA: Insufficient documentation

## 2019-04-03 DIAGNOSIS — R519 Headache, unspecified: Secondary | ICD-10-CM | POA: Insufficient documentation

## 2019-04-03 DIAGNOSIS — R509 Fever, unspecified: Secondary | ICD-10-CM | POA: Diagnosis not present

## 2019-04-03 DIAGNOSIS — M7918 Myalgia, other site: Secondary | ICD-10-CM | POA: Diagnosis not present

## 2019-04-03 DIAGNOSIS — U071 COVID-19: Secondary | ICD-10-CM

## 2019-04-03 DIAGNOSIS — R05 Cough: Secondary | ICD-10-CM | POA: Diagnosis present

## 2019-04-03 DIAGNOSIS — I10 Essential (primary) hypertension: Secondary | ICD-10-CM | POA: Diagnosis not present

## 2019-04-03 MED ORDER — PREDNISONE 10 MG PO TABS: ORAL_TABLET | ORAL | 0 refills | Status: DC

## 2019-04-03 MED ORDER — AZITHROMYCIN 250 MG PO TABS: ORAL_TABLET | ORAL | 0 refills | Status: DC

## 2019-04-03 NOTE — ED Provider Notes (Signed)
[facility] Emergency Department Provider Note  ____________________________________________   First MD Initiated Contact with Patient 04/03/19 1100     (approximate)  I have reviewed the triage vital signs and the nursing notes.   HISTORY  Chief Complaint URI and Covid+  HPI Lauren Maddox is a 43 y.o. female presents to the ED with complaint of cough, fever, body aches and headache for the past 10 days.  Patient states that she was tested for Covid on Friday and received her results has been positive.  It was after that her that her husband had a test that also reported as positive and was made aware that there was someone at his workplace that was positive prior to patient's symptoms.  She also has change in taste and smell.  Patient is a non-smoker.  She denies any previous respiratory problems.  She was advised by her PCP to come to the ED for chest x-ray.  She rates her pain as a 4 out of 10 at this time.      Past Medical History:  Diagnosis Date   Hypertension    Hypertension    Kidney stone     Patient Active Problem List   Diagnosis Date Noted   Menorrhagia with regular cycle 05/31/2018   Fibroid uterus 05/31/2018   Dysmenorrhea 05/31/2018   ASCUS with positive high risk HPV cervical 05/31/2018   Rectocele 03/17/2017   Urinary incontinence, mixed 04/13/2015   Benign essential HTN 04/13/2015    Past Surgical History:  Procedure Laterality Date   TUBAL LIGATION      Prior to Admission medications   Medication Sig Start Date End Date Taking? Authorizing Provider  azithromycin (ZITHROMAX Z-PAK) 250 MG tablet Take 2 tablets (500 mg) on  Day 1,  followed by 1 tablet (250 mg) once daily on Days 2 through 5. 04/03/19   Johnn Hai, PA-C  ibuprofen (ADVIL,MOTRIN) 800 MG tablet Take 800 mg by mouth. 02/07/17   [provider]  losartan (COZAAR) 50 MG tablet  07/13/17   [provider]  norethindrone  (MICRONOR,CAMILA,ERRIN) 0.35 MG tablet Take by mouth. 02/03/17 02/03/18  [provider]  oxybutynin (DITROPAN-XL) 5 MG 24 hr tablet  06/20/17   [provider]  predniSONE (DELTASONE) 10 MG tablet Take 6 tablets  today, on day 2 take 5 tablets, day 3 take 4 tablets, day 4 take 3 tablets, day 5 take  2 tablets and 1 tablet the last day 04/03/19   Johnn Hai, PA-C  tolterodine (DETROL) 2 MG tablet Take 2 mg by mouth 2 (two) times daily.    [provider]    Allergies Patient has no known allergies.  Family History  Problem Relation Age of Onset   Stroke Mother    Hypertension Mother    Diabetes Father    Breast cancer Maternal Aunt    Breast cancer Paternal Aunt    Stroke Maternal Grandfather     Social History Social History   Tobacco Use   Smoking status: Never Smoker   Smokeless tobacco: Never Used  Substance Use Topics   Alcohol use: Yes    Comment: occasional   Drug use: No    Review of Systems Constitutional: Positive fever/chills Eyes: No visual changes. ENT: No sore throat.  Positive changes for taste and smell. Cardiovascular: Denies chest pain. Respiratory: Denies shortness of breath.  Positive cough. Gastrointestinal: No abdominal pain.  No nausea, no vomiting.  Genitourinary: Negative for dysuria.  Musculoskeletal: Positive for muscle aches. Skin: Negative for rash. Neurological: Negative for headaches, focal weakness or numbness. ____________________________________________   PHYSICAL EXAM:  VITAL SIGNS: ED Triage Vitals [04/03/19 1107]  Enc Vitals Group     BP (!) 127/91     Pulse Rate (!) 126     Resp 20     Temp 100.1 F (37.8 C)     Temp Source Oral     SpO2 100 %     Weight      Height      Head Circumference      Peak Flow      Pain Score 4     Pain Loc      Pain Edu?      Excl. in [place]?    Constitutional: Alert and oriented. Well appearing and in no acute distress. Eyes: Conjunctivae are  normal.  Head: Atraumatic. Neck: No stridor.   Cardiovascular: Normal rate, regular rhythm. Grossly normal heart sounds.  Good peripheral circulation. Respiratory: Normal respiratory effort.  No retractions. Lungs CTAB.  Frequent congested cough. Gastrointestinal: Soft and nontender. No distention.  Musculoskeletal: Moves upper and lower extremities with any difficulty. Neurologic:  Normal speech and language. No gross focal neurologic deficits are appreciated. No gait instability. Skin:  Skin is warm, dry and intact.  Psychiatric: Mood and affect are normal. Speech and behavior are normal.  ____________________________________________   LABS (all labs ordered are listed, but only abnormal results are displayed)  Labs Reviewed - No data to display RADIOLOGY  Official radiology report(s): Dg Chest Portable 1 View  Result Date: 04/03/2019 CLINICAL DATA:  Cough and fever and shortness of breath for 10 days. History of positive COVID-19 test. EXAM: PORTABLE CHEST 1 VIEW COMPARISON:  None. FINDINGS: The cardiac silhouette, mediastinal and hilar contours are within normal limits. Low lung volumes with streaky atelectasis. Patchy bilateral infiltrates suspected. No pleural effusions. The bony thorax is intact. IMPRESSION: Patchy bilateral infiltrates. Electronically Signed   By: Marijo Sanes M.D.   On: 04/03/2019 11:52    ____________________________________________   PROCEDURES  Procedure(s) performed (including Critical Care):  Procedures   ____________________________________________   INITIAL IMPRESSION / ASSESSMENT AND PLAN / ED COURSE  As part of my medical decision making, I reviewed the following data within the electronic MEDICAL RECORD NUMBER Notes from prior ED visits and [place] Controlled Substance Database  Lauren Maddox was evaluated in Emergency Department on 04/03/2019 for the symptoms described in the history of present illness. She was evaluated in the context of  the global COVID-19 pandemic, which necessitated consideration that the patient might be at risk for infection with the SARS-CoV-2 virus that causes COVID-19. Institutional protocols and algorithms that pertain to the evaluation of patients at risk for COVID-19 are in a state of rapid change based on information released by regulatory bodies including the CDC and federal and state organizations. These policies and algorithms were followed during the patient's care in the ED.  43 year old female presents to the ED after receiving results of her Covid test being positive and advised to have a chest x-ray per her PCP.  Patient continues to have a fever, cough, decreased appetite due to changes in smell and taste.  Patient is a non-smoker and denies any difficulty breathing.  Husband is also positive as well.  Chest x-ray did show bilateral infiltrates and patient was made aware.  O2 sat was stable at 100% and patient was febrile at 100.1 while in triage.  Patient is  to continue with quarantine and note was given for work.  She was given a prescription for Zithromax and prednisone.  She was advised to return to the emergency department immediately if she did develops any shortness of breath or difficulty breathing.  ____________________________________________   FINAL CLINICAL IMPRESSION(S) / ED DIAGNOSES  Final diagnoses:  COVID-19  Bilateral pulmonary infiltrates on chest x-ray     ED Discharge Orders         Ordered    azithromycin (ZITHROMAX Z-PAK) 250 MG tablet     04/03/19 1207    predniSONE (DELTASONE) 10 MG tablet     04/03/19 1207           Note:  This document was prepared using Dragon voice recognition software and may include unintentional dictation errors.    Johnn Hai, PA-C 04/03/19 1222    Carrie Mew, MD 04/03/19 [phone]

## 2019-04-03 NOTE — ED Triage Notes (Signed)
Pt c/o cough with fever, SOB over the past 10 days,,  States she tested positive for covid in the past week and was referred to the ED for a chest xray. Pt is in NAD, respirations wnl, pt ambulatory to triage without any difficult or distress.

## 2019-04-03 NOTE — Discharge Instructions (Signed)
Follow-up with your primary care provider as needed.  return to the emergency department immediately if any severe worsening of your symptoms or shortness of breath.  Begin taking the prednisone as directed beginning with 6 tablets today and tapering down over the next 6 days x 1 tablet.  Also the Zithromax will treat early pneumonia and is intended to be taken over the next 5 days beginning with 2 tablets today.  Increase fluids.  Tylenol if needed for fever or body aches.  You will need to remain out of work until you are symptom-free and no fever for a minimum of 48 hours.

## 2019-04-03 NOTE — ED Notes (Signed)
Esign not working at this time. Pt verbalized discharge instructions and has no questions at this time. 

## 2019-05-16 ENCOUNTER — Other Ambulatory Visit (HOSPITAL_COMMUNITY)
Admission: RE | Admit: 2019-05-16 | Discharge: 2019-05-16 | Disposition: A | Discharge status: Home / Self Care | Payer: BC Managed Care – PPO | Source: Ambulatory Visit | Attending: Advanced Practice Midwife | Admitting: Advanced Practice Midwife

## 2019-05-16 ENCOUNTER — Encounter: Payer: Self-pay | Admitting: Advanced Practice Midwife

## 2019-05-16 ENCOUNTER — Other Ambulatory Visit: Payer: Self-pay

## 2019-05-16 ENCOUNTER — Ambulatory Visit (INDEPENDENT_AMBULATORY_CARE_PROVIDER_SITE_OTHER): Payer: BC Managed Care – PPO | Admitting: Advanced Practice Midwife

## 2019-05-16 VITALS — BP 126/96 | Ht 66.0 in | Wt 203.0 lb

## 2019-05-16 DIAGNOSIS — N898 Other specified noninflammatory disorders of vagina: Secondary | ICD-10-CM | POA: Diagnosis present

## 2019-05-16 DIAGNOSIS — Z113 Encounter for screening for infections with a predominantly sexual mode of transmission: Secondary | ICD-10-CM

## 2019-05-16 DIAGNOSIS — R300 Dysuria: Secondary | ICD-10-CM

## 2019-05-16 LAB — POCT URINALYSIS DIPSTICK
Bilirubin, UA: NEGATIVE
Blood, UA: NEGATIVE
Glucose, UA: NEGATIVE
Ketones, UA: NEGATIVE
Leukocytes, UA: NEGATIVE
Nitrite, UA: NEGATIVE
Protein, UA: POSITIVE — AB
Spec Grav, UA: 1.015 (ref 1.010–1.025)
Urobilinogen, UA: NEGATIVE E.U./dL — AB
pH, UA: 6 (ref 5.0–8.0)

## 2019-05-16 MED ORDER — FLUCONAZOLE 150 MG PO TABS: 150.0000 mg | ORAL_TABLET | Freq: Once | ORAL | 1 refills | Status: AC

## 2019-05-16 MED ORDER — METRONIDAZOLE 500 MG PO TABS: 500.0000 mg | ORAL_TABLET | Freq: Two times a day (BID) | ORAL | 0 refills | Status: AC

## 2019-05-16 NOTE — Progress Notes (Addendum)
Patient ID: Lauren Maddox, female   DOB: Oct 22, 1975, 43 y.o.   MRN: [id number]  Reason for Consult: Vaginitis (discharge after taking OTC yeast medication) and Urinary Tract Infection (burning with urination)    Subjective:     HPI:   Lauren Maddox is a 43 y.o. female being seen for vaginal irritation. She had Covid with pneumonia in October. After taking antibiotics she developed vaginal discharge and irritation. She used a 7 day OTC yeast treatment without relief. Her symptoms continue and have worsened some. Her vagina is irritated by urination. She has a thin discharge. She has not noticed any odor. She has not had any itching. She denies urethral burning or frequency of urination. She does not have any concerns for STDs but agrees to have testing today. She reports feeling better from her Covid infection. She has no other concerns at this time.   Past Medical History:  Diagnosis Date  . Hypertension   . Hypertension   . Kidney stone    Family History  Problem Relation Age of Onset  . Stroke Mother   . Hypertension Mother   . Diabetes Father   . Breast cancer Maternal Aunt   . Breast cancer Paternal Aunt   . Stroke Maternal Grandfather    Past Surgical History:  Procedure Laterality Date  . TUBAL LIGATION      Short Social History:  Social History   Tobacco Use  . Smoking status: Never Smoker  . Smokeless tobacco: Never Used  Substance Use Topics  . Alcohol use: Yes    Comment: occasional    No Known Allergies  Current Outpatient Medications  Medication Sig Dispense Refill  . losartan (COZAAR) 50 MG tablet     . fluconazole (DIFLUCAN) 150 MG tablet Take 1 tablet (150 mg total) by mouth once for 1 dose. Can take additional dose three days later if symptoms persist 1 tablet 1  . metroNIDAZOLE (FLAGYL) 500 MG tablet Take 1 tablet (500 mg total) by mouth 2 (two) times daily for 7 days. 14 tablet 0   No current facility-administered medications for  this visit.     Review of Systems  Constitutional: Negative.   HENT: Negative.   Eyes: Negative.   Respiratory: Negative.   Cardiovascular: Negative.   Gastrointestinal: Negative.   Genitourinary:       Vaginal discharge and irritation  Musculoskeletal: Negative.   Skin: Negative.   Neurological: Negative.   Endo/Heme/Allergies: Negative.   Psychiatric/Behavioral: Negative.        Objective:  Objective   Vitals:   05/16/19 1403  BP: (!) 126/96  Weight: 203 lb (92.1 kg)  Height: 5\' 6"  (1.676 m)   Body mass index is 32.77 kg/m. Constitutional: Well nourished, well developed female in no acute distress.  HEENT: normal Skin: Warm and dry.  Cardiovascular: Regular rate and rhythm.   Respiratory: Clear to auscultation bilateral. Normal respiratory effort Neuro: DTRs 2+, Cranial nerves grossly intact Psych: Alert and Oriented x3. No memory deficits. Normal mood and affect.  MS: normal gait, normal bilateral lower extremity ROM/strength/stability.  Pelvic exam:  is not limited by body habitus EGBUS: within normal limits Vagina: within normal limits and with normal pink mucosa, thin white/gray discharge  Data:  Results for AYN, CARRITHERS (MRN [id number]) as of 05/16/2019 14:46  Ref. Range 04/03/2019 11:47 05/16/2019 14:14  Bilirubin, UA Unknown  negative  Clarity, UA Unknown  clear  Color, UA Unknown  yellow  Glucose Latest Ref Range: Negative  Negative  Ketones, UA Unknown  negative  Leukocytes,UA Latest Ref Range: Negative   Negative  Nitrite, UA Unknown  neg  pH, UA Latest Ref Range: 5.0 - 8.0   6.0  Protein,UA Latest Ref Range: Negative   Positive (A)  Specific Gravity, UA Latest Ref Range: 1.010 - 1.025   1.015  Urobilinogen, UA Latest Ref Range: 0.2 or 1.0 E.U./dL  negative (A)  RBC, UA Unknown  negative       Assessment/Plan:     43 yo with possible BV  Rx Metronidazole and Diflucan sent to patient pharmacy [facility] for vaginitis and STD testing   Recommendations to prevent vaginitis reviewed with patient Return to clinic PRN for worsening symptoms and for annual exam   [facility] 05/16/2019, 2:42 PM

## 2019-05-20 LAB — CERVICOVAGINAL ANCILLARY ONLY
Bacterial Vaginitis (gardnerella): NEGATIVE
Candida Glabrata: NEGATIVE
Candida Vaginitis: NEGATIVE
Chlamydia: NEGATIVE
Comment: NEGATIVE
Comment: NEGATIVE
Comment: NEGATIVE
Comment: NEGATIVE
Comment: NEGATIVE
Comment: NORMAL
Neisseria Gonorrhea: NEGATIVE
Trichomonas: NEGATIVE

## 2019-12-07 IMAGING — DX DG CHEST 1V PORT
1 series · 1 of 1 positions shown · non-contrast
Comparison: None.

CLINICAL DATA: Cough and fever and shortness of breath for 10 days.
History of positive [id number] test.

EXAM:
PORTABLE CHEST 1 VIEW

[chest ap]
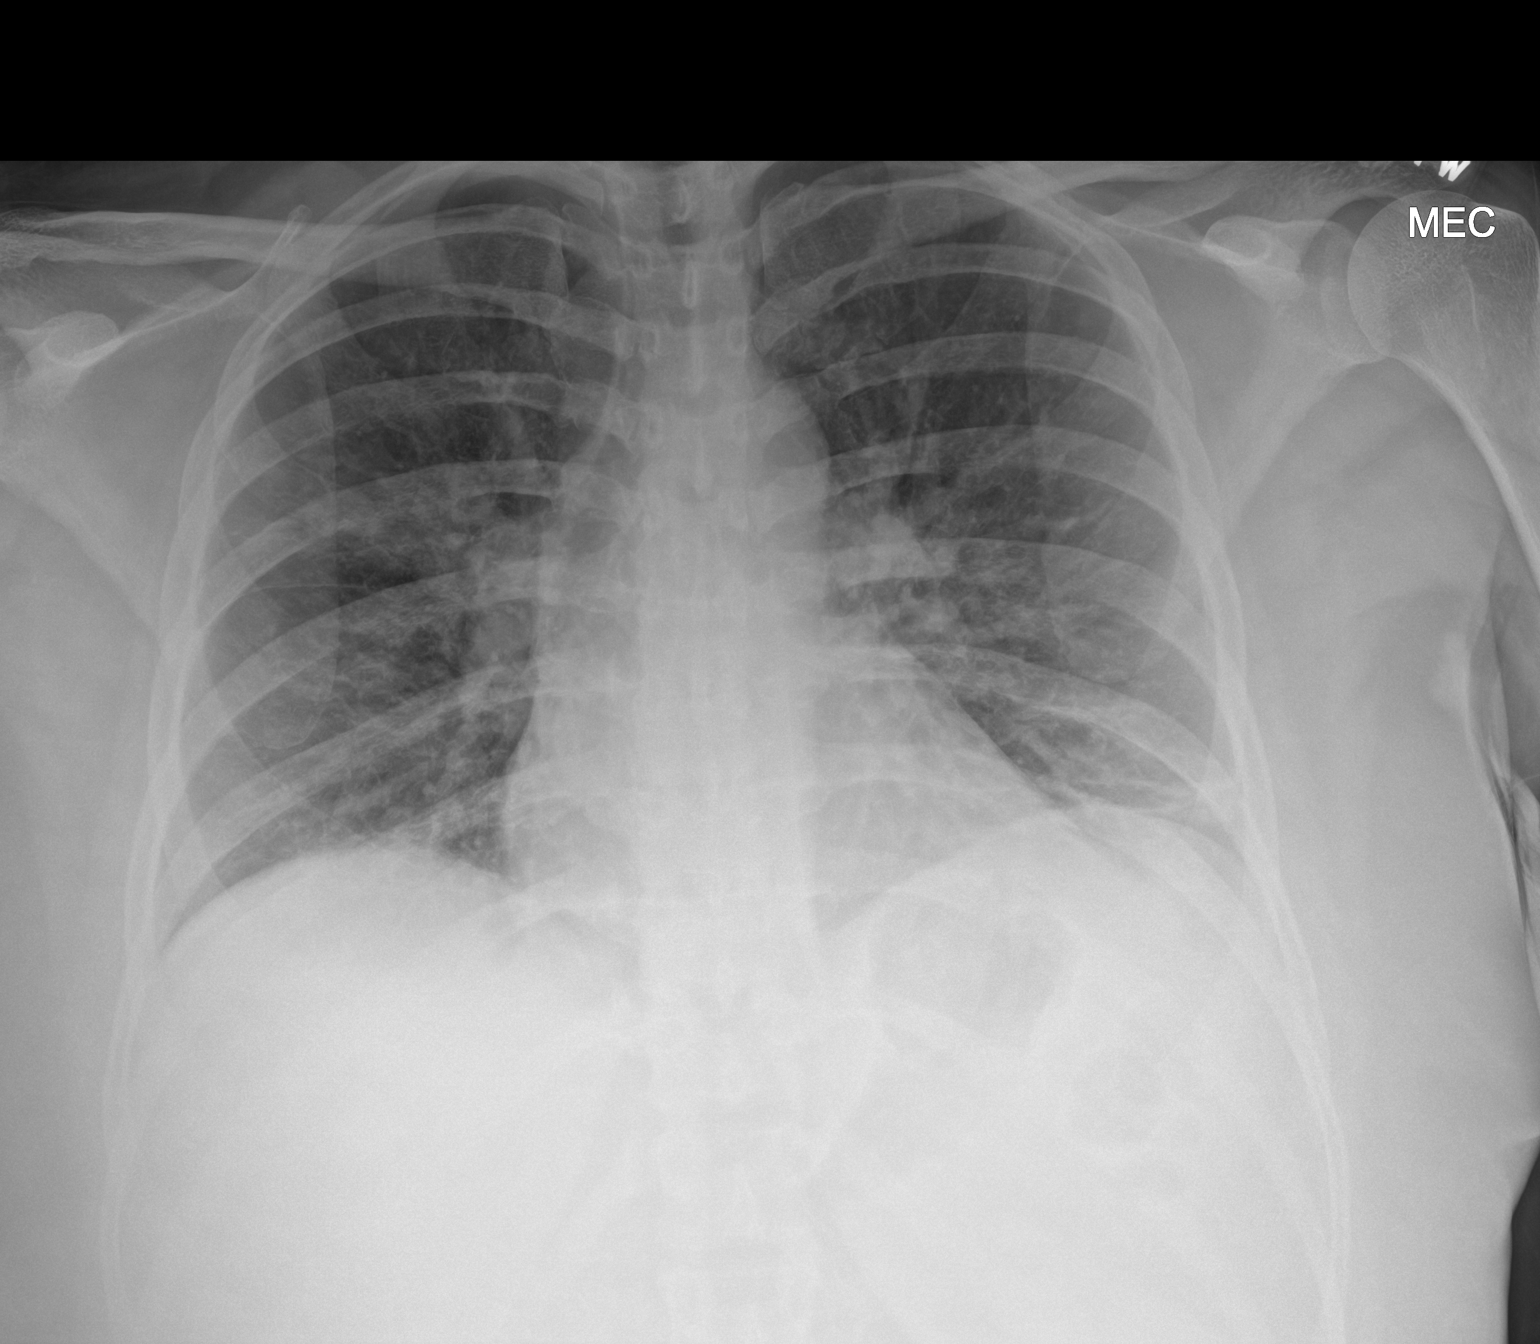

[1 of 1 positions shown; findings below may reference images not displayed]

FINDINGS: The cardiac silhouette, mediastinal and hilar contours are within
normal limits. Low lung volumes with streaky atelectasis. Patchy
bilateral infiltrates suspected. No pleural effusions. The bony
thorax is intact.
IMPRESSION: Patchy bilateral infiltrates.

## 2020-02-12 ENCOUNTER — Ambulatory Visit (INDEPENDENT_AMBULATORY_CARE_PROVIDER_SITE_OTHER): Payer: BC Managed Care – PPO | Admitting: Advanced Practice Midwife

## 2020-02-12 ENCOUNTER — Other Ambulatory Visit: Payer: Self-pay

## 2020-02-12 ENCOUNTER — Encounter: Payer: Self-pay | Admitting: Advanced Practice Midwife

## 2020-02-12 ENCOUNTER — Other Ambulatory Visit (HOSPITAL_COMMUNITY)
Admission: RE | Admit: 2020-02-12 | Discharge: 2020-02-12 | Disposition: A | Discharge status: Home / Self Care | Payer: BC Managed Care – PPO | Source: Ambulatory Visit | Attending: Advanced Practice Midwife | Admitting: Advanced Practice Midwife

## 2020-02-12 VITALS — BP 126/85 | HR 98 | Ht 67.0 in | Wt 191.0 lb

## 2020-02-12 DIAGNOSIS — Z113 Encounter for screening for infections with a predominantly sexual mode of transmission: Secondary | ICD-10-CM

## 2020-02-12 DIAGNOSIS — Z01419 Encounter for gynecological examination (general) (routine) without abnormal findings: Secondary | ICD-10-CM | POA: Insufficient documentation

## 2020-02-12 DIAGNOSIS — Z1239 Encounter for other screening for malignant neoplasm of breast: Secondary | ICD-10-CM | POA: Diagnosis not present

## 2020-02-12 DIAGNOSIS — Z124 Encounter for screening for malignant neoplasm of cervix: Secondary | ICD-10-CM | POA: Insufficient documentation

## 2020-02-12 NOTE — Progress Notes (Signed)
Gynecology Annual Exam  Date of Service: 02/12/2020  PCP: [facility]  Chief Complaint:  Chief Complaint  Patient presents with  . Gynecologic Exam    History of uterine fibroids  . Menorrhagia    menses are heavy with clots    History of Present Illness: Patient is a 44 y.o. G5P5 presents for annual exam. The patient has complaints today of ongoing heavy with clots and painful periods since having her tubes tied 18 years ago. She does not want to have uterine ablation or take hormones. Advised her to discuss further with MD if she changes her mind about ablation procedure.  LMP: Patient's last menstrual period was 01/26/2020. Average Interval: regular, 30 days Duration of flow: 5-6 days Heavy Menses: 2 days of heavy flow Clots: yes Intermenstrual Bleeding: no Postcoital Bleeding: no Dysmenorrhea: yes   The patient is sexually active. She currently uses tubal ligation for contraception. She denies dyspareunia.  The patient does perform self breast exams.  There is notable family history of breast or ovarian cancer in her family. Her maternal aunt was in her 47s (not known if less than 52) when diagnosed with breast cancer and her paternal aunt was diagnosed with breast cancer at unknown age. She is advised to find out age of maternal aunt at diagnosis if she desires MyRisk testing.   The patient wears seatbelts: yes.   The patient has regular exercise: she works out in her home gym. She admits healthy diet generally and increased carb intake at times. She admits adequate hydration and sleep.    The patient denies current symptoms of depression.    Review of Systems: Review of Systems  Constitutional: Negative for chills and fever.  HENT: Negative for congestion, ear discharge, ear pain, hearing loss, sinus pain and sore throat.   Eyes: Negative for blurred vision and double vision.  Respiratory: Negative for cough, shortness of breath and wheezing.   Cardiovascular:  Negative for chest pain, palpitations and leg swelling.  Gastrointestinal: Negative for abdominal pain, blood in stool, constipation, diarrhea, heartburn, melena, nausea and vomiting.  Genitourinary: Negative for dysuria, flank pain, frequency, hematuria and urgency.  Musculoskeletal: Negative for back pain, joint pain and myalgias.  Skin: Negative for itching and rash.  Neurological: Negative for dizziness, tingling, tremors, sensory change, speech change, focal weakness, seizures, loss of consciousness, weakness and headaches.  Endo/Heme/Allergies: Negative for environmental allergies. Does not bruise/bleed easily.       Positive for heavy painful periods  Psychiatric/Behavioral: Negative for depression, hallucinations, memory loss, substance abuse and suicidal ideas. The patient is not nervous/anxious and does not have insomnia.     Past Medical History:  Patient Active Problem List   Diagnosis Date Noted  . Menorrhagia with regular cycle 05/31/2018  . Fibroid uterus 05/31/2018  . Dysmenorrhea 05/31/2018  . ASCUS with positive high risk HPV cervical 05/31/2018  . Rectocele 03/17/2017  . Urinary incontinence, mixed 04/13/2015  . Benign essential HTN 04/13/2015    Past Surgical History:  Past Surgical History:  Procedure Laterality Date  . TUBAL LIGATION      Gynecologic History:  Patient's last menstrual period was 01/26/2020. Contraception: tubal ligation Last Pap: 2 years ago Results were:  ASCUS with Positive high risk HPV followed by a normal colposcopy Last mammogram: no record of mammogram in chart  Obstetric History: G5P5  Family History:  Family History  Problem Relation Age of Onset  . Stroke Mother   . Hypertension Mother   .  Diabetes Father   . Breast cancer Maternal Aunt   . Breast cancer Paternal Aunt   . Stroke Maternal Grandfather     Social History:  Social History   Socioeconomic History  . Marital status: Married    Spouse name: Not on file  .  Number of children: Not on file  . Years of education: Not on file  . Highest education level: Not on file  Occupational History  . Not on file  Tobacco Use  . Smoking status: Never Smoker  . Smokeless tobacco: Never Used  Vaping Use  . Vaping Use: Never used  Substance and Sexual Activity  . Alcohol use: Yes    Comment: occasional  . Drug use: No  . Sexual activity: Yes  Other Topics Concern  . Not on file  Social History Narrative  . Not on file   Social Determinants of Health   Financial Resource Strain:   . Difficulty of Paying Living Expenses: Not on file  Food Insecurity:   . Worried About Charity fundraiser in the Last Year: Not on file  . Ran Out of Food in the Last Year: Not on file  Transportation Needs:   . Lack of Transportation (Medical): Not on file  . Lack of Transportation (Non-Medical): Not on file  Physical Activity:   . Days of Exercise per Week: Not on file  . Minutes of Exercise per Session: Not on file  Stress:   . Feeling of Stress : Not on file  Social Connections:   . Frequency of Communication with Friends and Family: Not on file  . Frequency of Social Gatherings with Friends and Family: Not on file  . Attends Religious Services: Not on file  . Active Member of Clubs or Organizations: Not on file  . Attends Archivist Meetings: Not on file  . Marital Status: Not on file  Intimate Partner Violence:   . Fear of Current or Ex-Partner: Not on file  . Emotionally Abused: Not on file  . Physically Abused: Not on file  . Sexually Abused: Not on file    Allergies:  No Known Allergies  Medications: Prior to Admission medications   Medication Sig Start Date End Date Taking? Authorizing Provider  ibuprofen (ADVIL) 800 MG tablet Take 800 mg by mouth 3 (three) times daily. 10/13/19  Yes [provider]  ketoconazole (NIZORAL) 2 % shampoo Apply topically. 02/28/19 02/28/20 Yes [provider]  losartan (COZAAR) 50 MG tablet   07/13/17  Yes [provider]    Physical Exam Vitals: Blood pressure 126/85, pulse 98, height 5\' 7"  (1.702 m), weight 191 lb (86.6 kg), last menstrual period 01/26/2020.  General: NAD HEENT: normocephalic, anicteric Thyroid: no enlargement, no palpable nodules Pulmonary: No increased work of breathing, CTAB Cardiovascular: RRR, distal pulses 2+ Breast: Breast symmetrical, no tenderness, no palpable nodules or masses, no skin or nipple retraction present, no nipple discharge.  No axillary or supraclavicular lymphadenopathy. Abdomen: NABS, soft, non-tender, non-distended.  Umbilicus without lesions.  No hepatomegaly, splenomegaly or masses palpable. No evidence of hernia  Genitourinary:  External: Normal external female genitalia.  Normal urethral meatus, normal Bartholin's and Skene's glands.    Vagina: Normal vaginal mucosa, some evidence of relaxed muscle tone/bulging of vaginal tissue, decreased muscle tone on exam. No prolapse  Cervix: Grossly normal in appearance, no bleeding, no CMT  Uterus: Non-enlarged, mobile, normal contour.    Adnexa: ovaries non-enlarged, no adnexal masses  Rectal: deferred  Lymphatic: no  evidence of inguinal lymphadenopathy Extremities: no edema, erythema, or tenderness Neurologic: Grossly intact Psychiatric: mood appropriate, affect full   Assessment: 44 y.o. G5P5 routine annual exam  Plan: Problem List Items Addressed This Visit    None    Visit Diagnoses    Well woman exam with routine gynecological exam    -  Primary   Relevant Orders   Cytology - PAP   MM DIGITAL SCREENING BILATERAL   Screen for sexually transmitted diseases       Relevant Orders   Cytology - PAP   Cervical cancer screening       Relevant Orders   Cytology - PAP   Encounter for screening for malignant neoplasm of breast, unspecified screening modality       Relevant Orders   MM DIGITAL SCREENING BILATERAL      1) Mammogram - recommend yearly screening  mammogram.  Mammogram Was ordered today   2) STI screening  was  offered and declined  3) ASCCP guidelines and rationale discussed.  Patient opts for every 3 years screening interval if today's PAP is normal  4) Contraception - the patient is currently using  tubal ligation.  She is happy with her current form of contraception and plans to continue  5) Colonoscopy -- Screening recommended starting at age 57 for average risk individuals, age 72 for individuals deemed at increased risk (including African Americans) and recommended to continue until age 40.  For patient age 81-85 individualized approach is recommended.  Gold standard screening is via colonoscopy, Cologuard screening is an acceptable alternative for patient unwilling or unable to undergo colonoscopy.  "Colorectal cancer screening for average?risk adults: 2018 guideline update from the American Cancer Society"[place]: A Cancer Journal for Clinicians: Nov 09, 2016   6) Routine healthcare maintenance including cholesterol, diabetes screening discussed Declines  7) Return in about 1 year (around 02/11/2021) for annual established gyn.   Rod Can, [facility] 02/12/2020, 5:52 PM

## 2020-02-21 LAB — CYTOLOGY - PAP
Chlamydia: NEGATIVE
Comment: NEGATIVE
Comment: NEGATIVE
Comment: NEGATIVE
Comment: NEGATIVE
Comment: NEGATIVE
Comment: NORMAL
Diagnosis: UNDETERMINED — AB
HPV 16: NEGATIVE
HPV 18 / 45: NEGATIVE
High risk HPV: POSITIVE — AB
Neisseria Gonorrhea: NEGATIVE
Trichomonas: NEGATIVE

## 2020-02-21 NOTE — Telephone Encounter (Signed)
Patient has seen results in my chart and provider has sent pt a message regarding the results.

## 2022-07-26 ENCOUNTER — Other Ambulatory Visit (HOSPITAL_COMMUNITY)
Admission: RE | Admit: 2022-07-26 | Discharge: 2022-07-26 | Disposition: A | Discharge status: Home / Self Care | Payer: BC Managed Care – PPO | Source: Ambulatory Visit | Attending: Obstetrics & Gynecology | Admitting: Obstetrics & Gynecology

## 2022-07-26 ENCOUNTER — Ambulatory Visit (INDEPENDENT_AMBULATORY_CARE_PROVIDER_SITE_OTHER): Payer: BC Managed Care – PPO | Admitting: Obstetrics & Gynecology

## 2022-07-26 ENCOUNTER — Encounter: Payer: Self-pay | Admitting: Obstetrics & Gynecology

## 2022-07-26 VITALS — BP 120/80 | Ht 66.0 in | Wt 181.0 lb

## 2022-07-26 DIAGNOSIS — N898 Other specified noninflammatory disorders of vagina: Secondary | ICD-10-CM | POA: Diagnosis present

## 2022-07-26 DIAGNOSIS — N760 Acute vaginitis: Secondary | ICD-10-CM | POA: Insufficient documentation

## 2022-07-26 DIAGNOSIS — R300 Dysuria: Secondary | ICD-10-CM

## 2022-07-26 DIAGNOSIS — N920 Excessive and frequent menstruation with regular cycle: Secondary | ICD-10-CM

## 2022-07-26 DIAGNOSIS — M545 Low back pain, unspecified: Secondary | ICD-10-CM

## 2022-07-26 DIAGNOSIS — Z1231 Encounter for screening mammogram for malignant neoplasm of breast: Secondary | ICD-10-CM

## 2022-07-26 DIAGNOSIS — Z124 Encounter for screening for malignant neoplasm of cervix: Secondary | ICD-10-CM | POA: Diagnosis present

## 2022-07-26 DIAGNOSIS — Z01419 Encounter for gynecological examination (general) (routine) without abnormal findings: Secondary | ICD-10-CM | POA: Insufficient documentation

## 2022-07-26 DIAGNOSIS — B9689 Other specified bacterial agents as the cause of diseases classified elsewhere: Secondary | ICD-10-CM | POA: Insufficient documentation

## 2022-07-26 DIAGNOSIS — D500 Iron deficiency anemia secondary to blood loss (chronic): Secondary | ICD-10-CM

## 2022-07-26 LAB — POCT URINALYSIS DIPSTICK
Bilirubin, UA: NEGATIVE
Glucose, UA: NEGATIVE
Ketones, UA: NEGATIVE
Leukocytes, UA: NEGATIVE
Nitrite, UA: NEGATIVE
Protein, UA: NEGATIVE
Spec Grav, UA: 1.01 (ref 1.010–1.025)
Urobilinogen, UA: 1 E.U./dL
pH, UA: 6.5 (ref 5.0–8.0)

## 2022-07-26 MED ORDER — ACCRUFER 30 MG PO CAPS: ORAL_CAPSULE | ORAL | 6 refills | Status: AC

## 2022-07-26 NOTE — Progress Notes (Signed)
Subjective:    Lauren Maddox is a 47 y.o. married P5 who presents for an annual exam. She reports an occasional vaginal discharge. She has had BV in the past. She also reports some recent dysuria. She reports some LLQ discomfort. She has very heavy periods with clots. She was found to have a hbg of 10 with her labs 05/2022. SHe doesn't take her iron pills due to constipation.The patient is sexually active. She had a BTL in the past. GYN screening history: last pap: was abnormal: ASCUS + HR HPV in 2021 and 2019. She had a normal colpo in 2019 but no follow up since.   The patient wears seatbelts: yes. The patient participates in regular exercise: no. Has the patient ever been transfused or tattooed?: yes. The patient reports that there is not domestic violence in her life.   Menstrual History: OB History     Gravida  5   Para  5   Term      Preterm      AB      Living  5      SAB      IAB      Ectopic      Multiple      Live Births            FH-  breast cancer in maternal aunts x 2 and paternal aunt x 1, no gyn or colon cancer  Patient's last menstrual period was 07/13/2022. Period Cycle (Days): 28 Period Duration (Days): 6 Period Pattern: Regular Menstrual Flow: Heavy Dysmenorrhea: (!) Severe Dysmenorrhea Symptoms: Cramping  The following portions of the patient's history were reviewed and updated as appropriate: allergies, current medications, past family history, past medical history, past social history, past surgical history, and problem list.  Review of Systems Pertinent items are noted in HPI.    Objective:    BP 120/80   Ht 5' 6"$  (1.676 m)   Wt 181 lb (82.1 kg)   LMP 07/13/2022   BMI 29.21 kg/m   General Appearance:    Alert, cooperative, no distress, appears stated age  Head:    Normocephalic, without obvious abnormality, atraumatic  Eyes:    PERRL, conjunctiva/corneas clear, EOM's intact, fundi    benign, both eyes  Ears:    Normal TM's  and external ear canals, both ears  Nose:   Nares normal, septum midline, mucosa normal, no drainage    or sinus tenderness  Throat:   Lips, mucosa, and tongue normal; teeth and gums normal  Neck:   Supple, symmetrical, trachea midline, no adenopathy;    thyroid:  no enlargement/tenderness/nodules; no carotid   bruit or JVD  Back:     Symmetric, no curvature, ROM normal, no CVA tenderness  Lungs:     Clear to auscultation bilaterally, respirations unlabored  Chest Wall:    No tenderness or deformity   Heart:    Regular rate and rhythm, S1 and S2 normal, no murmur, rub   or gallop  Breast Exam:    No tenderness, masses, or nipple abnormality  Abdomen:     Soft, non-tender, bowel sounds active all four quadrants,    no masses, no organomegaly  Genitalia:    Normal female without lesion, discharge or tenderness, bimanual exam reveals a 14 week size uterus c/w fibroids     Extremities:   Extremities normal, atraumatic, no cyanosis or edema  Pulses:   2+ and symmetric all extremities  Skin:   Skin color, texture,  turgor normal, no rashes or lesions  Lymph nodes:   Cervical, supraclavicular, and axillary nodes normal  Neurologic:   CNII-XII intact, normal strength, sensation and reflexes    throughout  .    Assessment:    Healthy female exam.   Vaginal discharge Dysuria Anemia Screening for breast and cervical cancers Plan:     Thin prep Pap smear. With HPV cotesting Urine culture Aptima testing Gyn ultrasound Accrufer BID

## 2022-07-27 LAB — CERVICOVAGINAL ANCILLARY ONLY
Bacterial Vaginitis (gardnerella): POSITIVE — AB
Candida Glabrata: NEGATIVE
Candida Vaginitis: NEGATIVE
Comment: NEGATIVE
Comment: NEGATIVE
Comment: NEGATIVE
Comment: NEGATIVE
Trichomonas: NEGATIVE

## 2022-07-28 ENCOUNTER — Other Ambulatory Visit: Payer: Self-pay

## 2022-07-28 DIAGNOSIS — N760 Acute vaginitis: Secondary | ICD-10-CM

## 2022-07-28 MED ORDER — METRONIDAZOLE 500 MG PO TABS: 500.0000 mg | ORAL_TABLET | Freq: Two times a day (BID) | ORAL | 0 refills | Status: AC

## 2022-07-29 LAB — CYTOLOGY - PAP
Chlamydia: NEGATIVE
Comment: NEGATIVE
Comment: NEGATIVE
Comment: NORMAL
High risk HPV: POSITIVE — AB
Neisseria Gonorrhea: NEGATIVE

## 2022-07-29 LAB — URINE CULTURE

## 2022-08-01 ENCOUNTER — Ambulatory Visit
Admission: RE | Admit: 2022-08-01 | Discharge: 2022-08-01 | Disposition: A | Discharge status: Home / Self Care | Payer: BC Managed Care – PPO | Source: Ambulatory Visit | Attending: Obstetrics & Gynecology | Admitting: Obstetrics & Gynecology

## 2022-08-01 ENCOUNTER — Other Ambulatory Visit: Payer: Self-pay | Admitting: Obstetrics & Gynecology

## 2022-08-01 ENCOUNTER — Encounter: Payer: Self-pay | Admitting: Obstetrics & Gynecology

## 2022-08-01 DIAGNOSIS — N3000 Acute cystitis without hematuria: Secondary | ICD-10-CM

## 2022-08-01 DIAGNOSIS — B977 Papillomavirus as the cause of diseases classified elsewhere: Secondary | ICD-10-CM

## 2022-08-01 DIAGNOSIS — D500 Iron deficiency anemia secondary to blood loss (chronic): Secondary | ICD-10-CM | POA: Diagnosis present

## 2022-08-01 DIAGNOSIS — N920 Excessive and frequent menstruation with regular cycle: Secondary | ICD-10-CM | POA: Insufficient documentation

## 2022-08-01 DIAGNOSIS — N760 Acute vaginitis: Secondary | ICD-10-CM

## 2022-08-01 DIAGNOSIS — R87612 Low grade squamous intraepithelial lesion on cytologic smear of cervix (LGSIL): Secondary | ICD-10-CM

## 2022-08-01 MED ORDER — METRONIDAZOLE 500 MG PO TABS: 500.0000 mg | ORAL_TABLET | Freq: Two times a day (BID) | ORAL | 0 refills | Status: AC

## 2022-08-01 MED ORDER — AMPICILLIN 500 MG PO CAPS: 500.0000 mg | ORAL_CAPSULE | Freq: Four times a day (QID) | ORAL | 0 refills | Status: AC

## 2022-08-01 NOTE — Progress Notes (Signed)
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

## 2022-08-02 ENCOUNTER — Encounter: Payer: Self-pay | Admitting: Obstetrics & Gynecology

## 2022-08-03 NOTE — Telephone Encounter (Signed)
-----   Message from Emily Filbert, MD sent at 08/01/2022  7:51 AM EST ----- She needs a colpo. I sent her a message about this. Thanks

## 2022-08-03 NOTE — Telephone Encounter (Signed)
I contacted patient via phone. Patient is scheduled for 3/7 for Follow up appointment. I called and spoke with patient about adding Colpo to that appointment day. Patient states she isn't sure she want to do this procedure.

## 2022-08-18 ENCOUNTER — Ambulatory Visit: Payer: BC Managed Care – PPO | Admitting: Obstetrics & Gynecology

## 2024-07-05 ENCOUNTER — Ambulatory Visit: Payer: Self-pay
